# Patient Record
Sex: Male | Born: 1940 | Race: White | Hispanic: No | Smoking: Never smoker
Health system: Southern US, Community
[De-identification: ages and names within clinical notes are randomized; demographics above are authoritative.]

## PROBLEM LIST (undated history)

## (undated) DIAGNOSIS — D039 Melanoma in situ, unspecified: Secondary | ICD-10-CM

## (undated) DIAGNOSIS — Z9989 Dependence on other enabling machines and devices: Secondary | ICD-10-CM

## (undated) DIAGNOSIS — G4730 Sleep apnea, unspecified: Secondary | ICD-10-CM

## (undated) DIAGNOSIS — D229 Melanocytic nevi, unspecified: Secondary | ICD-10-CM

## (undated) DIAGNOSIS — I10 Essential (primary) hypertension: Secondary | ICD-10-CM

## (undated) DIAGNOSIS — G459 Transient cerebral ischemic attack, unspecified: Secondary | ICD-10-CM

## (undated) DIAGNOSIS — G4733 Obstructive sleep apnea (adult) (pediatric): Secondary | ICD-10-CM

## (undated) DIAGNOSIS — C4491 Basal cell carcinoma of skin, unspecified: Secondary | ICD-10-CM

## (undated) DIAGNOSIS — R232 Flushing: Secondary | ICD-10-CM

## (undated) DIAGNOSIS — E669 Obesity, unspecified: Secondary | ICD-10-CM

## (undated) DIAGNOSIS — E785 Hyperlipidemia, unspecified: Secondary | ICD-10-CM

## (undated) DIAGNOSIS — C4492 Squamous cell carcinoma of skin, unspecified: Secondary | ICD-10-CM

## (undated) HISTORY — DX: Obesity, unspecified: E66.9

## (undated) HISTORY — DX: Dependence on other enabling machines and devices: Z99.89

## (undated) HISTORY — DX: Essential (primary) hypertension: I10

## (undated) HISTORY — DX: Obstructive sleep apnea (adult) (pediatric): G47.33

## (undated) HISTORY — DX: Hyperlipidemia, unspecified: E78.5

## (undated) HISTORY — DX: Flushing: R23.2

## (undated) HISTORY — DX: Transient cerebral ischemic attack, unspecified: G45.9

## (undated) HISTORY — DX: Sleep apnea, unspecified: G47.30

---
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
## 1998-12-20 HISTORY — PX: LASIK: SHX215

## 2000-12-20 HISTORY — PX: MOHS SURGERY: SUR867

## 2007-04-03 DIAGNOSIS — D229 Melanocytic nevi, unspecified: Secondary | ICD-10-CM

## 2007-04-03 HISTORY — DX: Melanocytic nevi, unspecified: D22.9

## 2011-02-13 ENCOUNTER — Emergency Department (HOSPITAL_COMMUNITY)
Admission: EM | Admit: 2011-02-13 | Discharge: 2011-02-13 | Disposition: A | Discharge status: Home / Self Care | Payer: 59 | Attending: Emergency Medicine | Admitting: Emergency Medicine

## 2011-02-13 DIAGNOSIS — I10 Essential (primary) hypertension: Secondary | ICD-10-CM | POA: Insufficient documentation

## 2011-02-13 DIAGNOSIS — Z79899 Other long term (current) drug therapy: Secondary | ICD-10-CM | POA: Insufficient documentation

## 2011-09-08 ENCOUNTER — Encounter (INDEPENDENT_AMBULATORY_CARE_PROVIDER_SITE_OTHER): Payer: 59 | Admitting: Ophthalmology

## 2011-09-08 DIAGNOSIS — H43819 Vitreous degeneration, unspecified eye: Secondary | ICD-10-CM

## 2011-09-08 DIAGNOSIS — H33309 Unspecified retinal break, unspecified eye: Secondary | ICD-10-CM

## 2011-09-08 DIAGNOSIS — H2510 Age-related nuclear cataract, unspecified eye: Secondary | ICD-10-CM

## 2011-10-21 DEATH — deceased

## 2012-09-07 ENCOUNTER — Encounter (INDEPENDENT_AMBULATORY_CARE_PROVIDER_SITE_OTHER): Payer: 59 | Admitting: Ophthalmology

## 2012-09-13 ENCOUNTER — Ambulatory Visit (INDEPENDENT_AMBULATORY_CARE_PROVIDER_SITE_OTHER): Payer: BC Managed Care – PPO | Admitting: Ophthalmology

## 2012-09-13 ENCOUNTER — Encounter (INDEPENDENT_AMBULATORY_CARE_PROVIDER_SITE_OTHER): Payer: 59 | Admitting: Ophthalmology

## 2012-09-13 DIAGNOSIS — H2510 Age-related nuclear cataract, unspecified eye: Secondary | ICD-10-CM

## 2012-09-13 DIAGNOSIS — H33309 Unspecified retinal break, unspecified eye: Secondary | ICD-10-CM

## 2012-09-13 DIAGNOSIS — H35039 Hypertensive retinopathy, unspecified eye: Secondary | ICD-10-CM

## 2012-09-13 DIAGNOSIS — H43819 Vitreous degeneration, unspecified eye: Secondary | ICD-10-CM

## 2012-09-13 DIAGNOSIS — I10 Essential (primary) hypertension: Secondary | ICD-10-CM

## 2013-06-04 ENCOUNTER — Other Ambulatory Visit: Payer: Self-pay | Admitting: *Deleted

## 2013-06-04 MED ORDER — OLMESARTAN MEDOXOMIL-HCTZ 40-25 MG PO TABS: 1.0000 | ORAL_TABLET | Freq: Every day | ORAL | Status: DC

## 2013-07-19 ENCOUNTER — Other Ambulatory Visit: Payer: Self-pay | Admitting: Internal Medicine

## 2013-07-21 ENCOUNTER — Other Ambulatory Visit: Payer: Self-pay | Admitting: Internal Medicine

## 2013-07-23 NOTE — Telephone Encounter (Signed)
Rx was sent to pharmacy electronically. 

## 2013-08-07 ENCOUNTER — Other Ambulatory Visit: Payer: Self-pay | Admitting: *Deleted

## 2013-08-07 MED ORDER — HYDRALAZINE HCL 25 MG PO TABS: 25.0000 mg | ORAL_TABLET | Freq: Three times a day (TID) | ORAL | Status: DC

## 2013-09-13 ENCOUNTER — Ambulatory Visit (INDEPENDENT_AMBULATORY_CARE_PROVIDER_SITE_OTHER): Payer: BC Managed Care – PPO | Admitting: Ophthalmology

## 2013-09-13 DIAGNOSIS — H33309 Unspecified retinal break, unspecified eye: Secondary | ICD-10-CM

## 2013-09-13 DIAGNOSIS — H2510 Age-related nuclear cataract, unspecified eye: Secondary | ICD-10-CM

## 2013-09-13 DIAGNOSIS — I10 Essential (primary) hypertension: Secondary | ICD-10-CM

## 2013-09-13 DIAGNOSIS — H43819 Vitreous degeneration, unspecified eye: Secondary | ICD-10-CM

## 2013-09-13 DIAGNOSIS — H35039 Hypertensive retinopathy, unspecified eye: Secondary | ICD-10-CM

## 2013-09-19 ENCOUNTER — Telehealth: Payer: Self-pay | Admitting: Internal Medicine

## 2013-09-19 MED ORDER — METOPROLOL SUCCINATE ER 50 MG PO TB24: ORAL_TABLET | ORAL | Status: DC

## 2013-09-19 NOTE — Telephone Encounter (Signed)
Ok .. That's fine with me.  -Dr. Rennis Golden

## 2013-09-19 NOTE — Telephone Encounter (Signed)
Patient called wanting to decreased metoprolol succinate 100mg  QD dose, as he reports HR stays low (usually 50s-low 60s, as low as 40, with little increase in HR with exercise) and reports BP is usually 120s systolic, as low as 106 systolic. Does not report SE with the decreased HR, just wants to see if he is able to wean back on the dosage. Please advise. Thanks!

## 2013-09-19 NOTE — Telephone Encounter (Signed)
Please call-question about his medicine. °

## 2013-09-19 NOTE — Telephone Encounter (Signed)
Called patient with medication change instructions per Dr. Rennis Golden. Informed patient that is ok to decreased metoprolol succinate dose from 100mg  to 75mg  QD PO pr Dr. Rennis Golden. New prescription ordered and sent to pharmacy electronically. Informed patient to keep record of BPs and HR and to call our office with any drastic changes from his current baseline in either. Patient verbalized understanding and agreed with plan.

## 2013-10-08 ENCOUNTER — Other Ambulatory Visit: Payer: Self-pay | Admitting: *Deleted

## 2013-10-08 MED ORDER — AMLODIPINE BESYLATE 5 MG PO TABS: 5.0000 mg | ORAL_TABLET | Freq: Two times a day (BID) | ORAL | Status: DC

## 2014-04-08 ENCOUNTER — Other Ambulatory Visit: Payer: Self-pay | Admitting: Internal Medicine

## 2014-04-08 NOTE — Telephone Encounter (Signed)
Rx was sent to pharmacy electronically. 

## 2014-04-23 ENCOUNTER — Other Ambulatory Visit: Payer: Self-pay | Admitting: *Deleted

## 2014-04-23 MED ORDER — HYDRALAZINE HCL 25 MG PO TABS: 25.0000 mg | ORAL_TABLET | Freq: Three times a day (TID) | ORAL | Status: DC

## 2014-04-23 NOTE — Telephone Encounter (Signed)
Rx refill sent to patient pharmacy   

## 2014-05-22 ENCOUNTER — Other Ambulatory Visit: Payer: Self-pay | Admitting: Internal Medicine

## 2014-05-27 NOTE — Telephone Encounter (Signed)
Pt still have not gotten his Furosemide 40 mg # 30.. Would you please call it in to [phone]

## 2014-05-28 NOTE — Telephone Encounter (Signed)
Rx was sent to pharmacy electronically. Last OV 03/2013 

## 2014-06-08 ENCOUNTER — Other Ambulatory Visit: Payer: Self-pay | Admitting: Internal Medicine

## 2014-06-11 NOTE — Telephone Encounter (Signed)
Rx refill sent to pharmacy. Called patient and informed him that he needed an appointment, he ask to please have scheduler call him.

## 2014-06-15 ENCOUNTER — Other Ambulatory Visit: Payer: Self-pay | Admitting: Internal Medicine

## 2014-06-17 NOTE — Telephone Encounter (Signed)
Rx refill sent to patient pharmacy   

## 2014-06-24 ENCOUNTER — Other Ambulatory Visit: Payer: Self-pay | Admitting: Internal Medicine

## 2014-06-25 NOTE — Telephone Encounter (Signed)
Rx refill sent to patient pharmacy   

## 2014-06-27 ENCOUNTER — Encounter: Payer: Self-pay | Admitting: *Deleted

## 2014-07-11 ENCOUNTER — Encounter: Payer: Self-pay | Admitting: Internal Medicine

## 2014-07-11 ENCOUNTER — Ambulatory Visit (INDEPENDENT_AMBULATORY_CARE_PROVIDER_SITE_OTHER): Payer: BC Managed Care – PPO | Admitting: Internal Medicine

## 2014-07-11 VITALS — BP 160/80 | HR 81 | Ht 67.0 in | Wt 196.5 lb

## 2014-07-11 DIAGNOSIS — Z789 Other specified health status: Secondary | ICD-10-CM

## 2014-07-11 DIAGNOSIS — I10 Essential (primary) hypertension: Secondary | ICD-10-CM

## 2014-07-11 DIAGNOSIS — Z8673 Personal history of transient ischemic attack (TIA), and cerebral infarction without residual deficits: Secondary | ICD-10-CM

## 2014-07-11 DIAGNOSIS — E785 Hyperlipidemia, unspecified: Secondary | ICD-10-CM

## 2014-07-11 DIAGNOSIS — G4733 Obstructive sleep apnea (adult) (pediatric): Secondary | ICD-10-CM

## 2014-07-11 DIAGNOSIS — Z9989 Dependence on other enabling machines and devices: Secondary | ICD-10-CM

## 2014-07-11 MED ORDER — HYDRALAZINE HCL 25 MG PO TABS: 25.0000 mg | ORAL_TABLET | Freq: Three times a day (TID) | ORAL | Status: DC

## 2014-07-11 NOTE — Patient Instructions (Signed)
Your physician wants you to follow-up in: 1 year. You will receive a reminder letter in the mail two months in advance. If you don't receive a letter, please call our office to schedule the follow-up appointment.  

## 2014-07-16 ENCOUNTER — Encounter: Payer: Self-pay | Admitting: Internal Medicine

## 2014-07-16 DIAGNOSIS — Z8673 Personal history of transient ischemic attack (TIA), and cerebral infarction without residual deficits: Secondary | ICD-10-CM | POA: Insufficient documentation

## 2014-07-16 DIAGNOSIS — Z789 Other specified health status: Secondary | ICD-10-CM | POA: Insufficient documentation

## 2014-07-16 DIAGNOSIS — E66811 Obesity, class 1: Secondary | ICD-10-CM | POA: Insufficient documentation

## 2014-07-16 DIAGNOSIS — G4733 Obstructive sleep apnea (adult) (pediatric): Secondary | ICD-10-CM | POA: Insufficient documentation

## 2014-07-16 DIAGNOSIS — E785 Hyperlipidemia, unspecified: Secondary | ICD-10-CM | POA: Insufficient documentation

## 2014-07-16 DIAGNOSIS — E669 Obesity, unspecified: Secondary | ICD-10-CM | POA: Insufficient documentation

## 2014-07-16 DIAGNOSIS — I10 Essential (primary) hypertension: Secondary | ICD-10-CM | POA: Insufficient documentation

## 2014-07-16 DIAGNOSIS — Z9989 Dependence on other enabling machines and devices: Secondary | ICD-10-CM

## 2014-07-16 NOTE — Progress Notes (Signed)
OFFICE NOTE  Chief Complaint:  Annual visit, occasional leg swelling  Primary Care Physician: No primary provider on file.  HPI:  Zachary Murphy  is a pleasant 73 year old gentleman with a history of possible TIA in the past, sleep apnea, on CPAP, hypertension, and dyslipidemia. He is not interested in statin medications due to families and friends who have had complications with them. Overall, he is doing fairly well. He started to get more activity and exercise outdoors. He has been chopping wood, doing yard work. He has had no chest pain, shortness of breath, palpitations, presyncope, or syncopal symptoms. He does have uncontrollable hypertension and some fatigue during the day. He is being treated with CPAP. He does note some improvement with that, and there was a question of whether he would benefit from [place] or Provigil in the past. As far as blood pressure medications, he continues on clonidine, which certainly is fatiguing for him, and we have tried to wean that down.  Today Zachary Murphy reports doing really well. He said the most energy is had in some time. Despite that he seems to be under the most stress she's been under. He is working towards retirement within the next year and has multiple projects is working on. In general though his blood pressure has been very well controlled despite reducing his clonidine. He likes to reduce that further if he could because of the side effects. We have decreased his metoprolol to 50 mg daily and in general he is doing quite well. He did bring a list of home blood pressures which generally range from the 6010'X to 323F systolic. He denies any chest pain or shortness of breath.  PMHx:  Past Medical History  Diagnosis Date  . TIA (transient ischemic attack)     Mini  . OSA on CPAP   . Hypertension   . Dyslipidemia   . Obesity     History reviewed. No pertinent past surgical history.  FAMHx:  Family History  Problem Relation Age of  Onset  . Stroke Mother   . Stroke Father   . Hypertension Brother     SOCHx:   reports that he has never smoked. He has never used smokeless tobacco. He reports that he drinks about 7 ounces of alcohol per week. He reports that he does not use illicit drugs.  ALLERGIES:  Allergies  Allergen Reactions  . Statins Other (See Comments)    myalgias    ROS: A comprehensive review of systems was negative except for: Cardiovascular: positive for lower extremity edema  HOME MEDS: Current Outpatient Prescriptions  Medication Sig Dispense Refill  . amLODipine (NORVASC) 5 MG tablet Take 1 tablet (5 mg total) by mouth daily. Appointment needed for further refills  30 tablet  1  . aspirin 325 MG tablet Take 325 mg by mouth daily.      . Cholecalciferol (VITAMIN D-3) 1000 UNITS CAPS Take 1 capsule by mouth daily.      . cloNIDine (CATAPRES) 0.2 MG tablet Take 0.2 mg by mouth 2 (two) times daily.      . furosemide (LASIX) 40 MG tablet Take 1 tablet (40 mg total) by mouth daily.  30 tablet  0  . hydrALAZINE (APRESOLINE) 25 MG tablet Take 1 tablet (25 mg total) by mouth 3 (three) times daily.  90 tablet  11  . metoprolol succinate (TOPROL-XL) 50 MG 24 hr tablet Take 100 mg by mouth daily. . Take with or immediately following a meal.      .  Multiple Vitamin (MULTIVITAMIN WITH MINERALS) TABS tablet Take 1 tablet by mouth daily.      . NON FORMULARY at bedtime. CPAP      . olmesartan-hydrochlorothiazide (BENICAR HCT) 40-25 MG per tablet Take 1 tablet by mouth daily.  30 tablet  2   No current facility-administered medications for this visit.    LABS/IMAGING: No results found for this or any previous visit (from the past 48 hour(s)). No results found.  VITALS: BP 160/80  Pulse 81  Ht 5\' 7"  (1.702 m)  Wt 196 lb 8 oz (89.132 kg)  BMI 30.77 kg/m2  EXAM: General appearance: alert and no distress Neck: no carotid bruit and no JVD Lungs: clear to auscultation bilaterally Heart: regular rate  and rhythm, S1, S2 normal, no murmur, click, rub or gallop Abdomen: soft, non-tender; bowel sounds normal; no masses,  no organomegaly Extremities: extremities normal, atraumatic, no cyanosis or edema Pulses: 2+ and symmetric Skin: Skin color, texture, turgor normal. No rashes or lesions Neurologic: Grossly normal PSych: Mood, affect normal  EKG: Normal sinus rhythm at 81  ASSESSMENT: 1. Hypertension-controlled 2. Obesity with weight loss efforts in place 3. Obstructive sleep apnea CPAP 4. Dyslipidemia-statin intolerant 5. History of TIA  PLAN: 1.   Zachary Murphy is doing well overall. His blood pressure is well controlled. He continues to lose some weight which I have applauded him on. He continues to use CPAP with good sleep at night and energy levels. Unfortunately he is statin intolerant and does need low cholesterol given his risk factors for heart disease and history of prior TIA.  I've recommended again a cholesterol check but he does not want to get that at this time. I'll plan to see him back annually or sooner as necessary.  Pixie Casino, MD, [facility] Attending Cardiologist [facility]  Zachary Murphy 07/16/2014, 7:47 PM

## 2014-07-22 ENCOUNTER — Other Ambulatory Visit: Payer: Self-pay | Admitting: Internal Medicine

## 2014-07-23 NOTE — Telephone Encounter (Signed)
Rx refill sent to patient pharmacy   

## 2014-08-13 ENCOUNTER — Other Ambulatory Visit: Payer: Self-pay | Admitting: Internal Medicine

## 2014-08-13 NOTE — Telephone Encounter (Signed)
Rx was sent to pharmacy electronically. 

## 2014-09-07 ENCOUNTER — Encounter: Payer: Self-pay | Admitting: Internal Medicine

## 2014-09-15 ENCOUNTER — Other Ambulatory Visit: Payer: Self-pay | Admitting: Internal Medicine

## 2014-09-16 NOTE — Telephone Encounter (Signed)
Rx was sent to pharmacy electronically. 

## 2014-09-17 ENCOUNTER — Ambulatory Visit (INDEPENDENT_AMBULATORY_CARE_PROVIDER_SITE_OTHER): Payer: BC Managed Care – PPO | Admitting: Ophthalmology

## 2014-09-19 ENCOUNTER — Ambulatory Visit (INDEPENDENT_AMBULATORY_CARE_PROVIDER_SITE_OTHER): Payer: BC Managed Care – PPO | Admitting: Ophthalmology

## 2014-09-19 DIAGNOSIS — I10 Essential (primary) hypertension: Secondary | ICD-10-CM

## 2014-09-19 DIAGNOSIS — H43813 Vitreous degeneration, bilateral: Secondary | ICD-10-CM

## 2014-09-19 DIAGNOSIS — H35033 Hypertensive retinopathy, bilateral: Secondary | ICD-10-CM

## 2014-09-19 DIAGNOSIS — H33303 Unspecified retinal break, bilateral: Secondary | ICD-10-CM

## 2014-10-16 ENCOUNTER — Other Ambulatory Visit: Payer: Self-pay | Admitting: Internal Medicine

## 2014-10-22 ENCOUNTER — Telehealth: Payer: Self-pay | Admitting: Internal Medicine

## 2014-10-22 MED ORDER — METOPROLOL SUCCINATE ER 100 MG PO TB24: ORAL_TABLET | ORAL | Status: DC

## 2014-10-22 NOTE — Telephone Encounter (Signed)
Per last refill note, and Dr Debara Pickett Patient wanted 100 mg metoprolol succ and take 1/2 tablet  Daily  Lattie Haw wanted to clarify prescription RN verified  LAST PRESCRIPTION IS CORRECT.

## 2014-10-22 NOTE — Telephone Encounter (Signed)
Called patient to clarify Metoprolol Succinate dose last night, lmtcb. Per last office note (07/11/14 with Dr Debara Pickett) he was taking Toprol XL 100 mg (2 50 mg tablets daily).  Patient returned call this morning. Per patient, he stating the dose was decreased to 75 mg. Patient reported that he has only been taking 50 mg of Metoprolol and that his heart rate has been consistently in the 60s (patient monitors blood pressure closely at home). Patient would actually like to have Metoprolol Succinate 100 mg tablets to cut in half.  Routed to [facility] to advise.

## 2014-10-22 NOTE — Telephone Encounter (Signed)
Lattie Haw called in regards to the Metoprolol prescription. She states that there are 2 different sets of directions and needs to clarify which directions are correct. Please call  Thanks

## 2014-10-22 NOTE — Addendum Note (Signed)
Addended by: Diana Eves on: 10/22/2014 12:16 PM   Modules accepted: Orders

## 2014-10-22 NOTE — Telephone Encounter (Signed)
50 mg daily is fine - he can take 1/2 of the 100 mg Toprol XL.  Dr. Lemmie Evens

## 2015-01-04 ENCOUNTER — Other Ambulatory Visit: Payer: Self-pay | Admitting: Internal Medicine

## 2015-01-06 NOTE — Telephone Encounter (Signed)
Rx(s) sent to pharmacy electronically.  

## 2015-03-10 ENCOUNTER — Other Ambulatory Visit: Payer: Self-pay | Admitting: Internal Medicine

## 2015-03-10 NOTE — Telephone Encounter (Signed)
Rx has been sent to the pharmacy electronically. ° °

## 2015-06-13 DIAGNOSIS — C4491 Basal cell carcinoma of skin, unspecified: Secondary | ICD-10-CM

## 2015-06-13 HISTORY — DX: Basal cell carcinoma of skin, unspecified: C44.91

## 2015-07-06 ENCOUNTER — Other Ambulatory Visit: Payer: Self-pay | Admitting: Internal Medicine

## 2015-08-04 ENCOUNTER — Other Ambulatory Visit: Payer: Self-pay | Admitting: Internal Medicine

## 2015-08-04 NOTE — Telephone Encounter (Signed)
Rx(s) sent to pharmacy electronically.  

## 2015-08-23 ENCOUNTER — Other Ambulatory Visit: Payer: Self-pay | Admitting: Internal Medicine

## 2015-09-02 ENCOUNTER — Other Ambulatory Visit: Payer: Self-pay | Admitting: Internal Medicine

## 2015-09-09 ENCOUNTER — Encounter: Payer: Self-pay | Admitting: Gastroenterology

## 2015-10-08 ENCOUNTER — Ambulatory Visit (INDEPENDENT_AMBULATORY_CARE_PROVIDER_SITE_OTHER): Payer: BC Managed Care – PPO | Admitting: Ophthalmology

## 2015-10-25 ENCOUNTER — Other Ambulatory Visit: Payer: Self-pay | Admitting: Internal Medicine

## 2015-10-27 ENCOUNTER — Other Ambulatory Visit: Payer: Self-pay | Admitting: Internal Medicine

## 2015-10-27 MED ORDER — CLONIDINE HCL 0.1 MG PO TABS: ORAL_TABLET | ORAL | Status: DC

## 2015-10-28 ENCOUNTER — Ambulatory Visit (AMBULATORY_SURGERY_CENTER): Payer: Self-pay

## 2015-10-28 VITALS — Ht 67.0 in | Wt 200.0 lb

## 2015-10-28 DIAGNOSIS — Z1211 Encounter for screening for malignant neoplasm of colon: Secondary | ICD-10-CM

## 2015-10-28 MED ORDER — SUPREP BOWEL PREP KIT 17.5-3.13-1.6 GM/177ML PO SOLN: 1.0000 | Freq: Once | ORAL | Status: DC

## 2015-10-28 NOTE — Progress Notes (Signed)
No allergies to eggs or soy No past problems with anesthesia No diet/weight loss meds No home oxygen  Has email; refused emmi

## 2015-11-04 ENCOUNTER — Encounter: Payer: Self-pay | Admitting: Gastroenterology

## 2015-11-11 ENCOUNTER — Ambulatory Visit (AMBULATORY_SURGERY_CENTER): Payer: BLUE CROSS/BLUE SHIELD | Admitting: Gastroenterology

## 2015-11-11 ENCOUNTER — Encounter: Payer: Self-pay | Admitting: Gastroenterology

## 2015-11-11 VITALS — BP 148/71 | HR 65 | Temp 96.8°F | Resp 21 | Ht 67.0 in | Wt 200.0 lb

## 2015-11-11 DIAGNOSIS — Z1211 Encounter for screening for malignant neoplasm of colon: Secondary | ICD-10-CM

## 2015-11-11 DIAGNOSIS — D124 Benign neoplasm of descending colon: Secondary | ICD-10-CM | POA: Diagnosis not present

## 2015-11-11 DIAGNOSIS — D123 Benign neoplasm of transverse colon: Secondary | ICD-10-CM | POA: Diagnosis not present

## 2015-11-11 MED ORDER — SODIUM CHLORIDE 0.9 % IV SOLN: 500.0000 mL | INTRAVENOUS | Status: DC

## 2015-11-11 NOTE — Progress Notes (Signed)
No problems noted in the recovery room. maw 

## 2015-11-11 NOTE — Patient Instructions (Signed)
YOU HAD AN ENDOSCOPIC PROCEDURE TODAY AT THE [facility]:   Refer to the procedure report that was given to you for any specific questions about what was found during the examination.  If the procedure report does not answer your questions, please call your gastroenterologist to clarify.  If you requested that your care partner not be given the details of your procedure findings, then the procedure report has been included in a sealed envelope for you to review at your convenience later.  YOU SHOULD EXPECT: Some feelings of bloating in the abdomen. Passage of more gas than usual.  Walking can help get rid of the air that was put into your GI tract during the procedure and reduce the bloating. If you had a lower endoscopy (such as a colonoscopy or flexible sigmoidoscopy) you may notice spotting of blood in your stool or on the toilet paper. If you underwent a bowel prep for your procedure, you may not have a normal bowel movement for a few days.  Please Note:  You might notice some irritation and congestion in your nose or some drainage.  This is from the oxygen used during your procedure.  There is no need for concern and it should clear up in a day or so.  SYMPTOMS TO REPORT IMMEDIATELY:   Following lower endoscopy (colonoscopy or flexible sigmoidoscopy):  Excessive amounts of blood in the stool  Significant tenderness or worsening of abdominal pains  Swelling of the abdomen that is new, acute  Fever of 100F or higher  For urgent or emergent issues, a gastroenterologist can be reached at any hour by calling [phone].   DIET: Your first meal following the procedure should be a small meal and then it is ok to progress to your normal diet. Heavy or fried foods are harder to digest and may make you feel nauseous or bloated.  Likewise, meals heavy in dairy and vegetables can increase bloating.  Drink plenty of fluids but you should avoid alcoholic beverages for 24  hours.  ACTIVITY:  You should plan to take it easy for the rest of today and you should NOT DRIVE or use heavy machinery until tomorrow (because of the sedation medicines used during the test).    FOLLOW UP: Our staff will call the number listed on your records the next business day following your procedure to check on you and address any questions or concerns that you may have regarding the information given to you following your procedure. If we do not reach you, we will leave a message.  However, if you are feeling well and you are not experiencing any problems, there is no need to return our call.  We will assume that you have returned to your regular daily activities without incident.  If any biopsies were taken you will be contacted by phone or by letter within the next 1-3 weeks.  Please call us at [phone] if you have not heard about the biopsies in 3 weeks.    SIGNATURES/CONFIDENTIALITY: You and/or your care partner have signed paperwork which will be entered into your electronic medical record.  These signatures attest to the fact that that the information above on your After Visit Summary has been reviewed and is understood.  Full responsibility of the confidentiality of this discharge information lies with you and/or your care-partner.    Handouts were given to your care partner on polyps, diverticulosis, and a high fiber diet with liberal fluid intake. You may resume your   current medications today. Await biopsy results. Please call if any questions or concerns.   

## 2015-11-11 NOTE — Op Note (Signed)
[place]  [address] [place], [id number]   COLONOSCOPY PROCEDURE REPORT  PATIENT: Zachary Murphy, Zachary Murphy  MR#: [id number] BIRTHDATE: February 13, 1941 , 73  yrs. old GENDER: male ENDOSCOPIST: Milus Banister, MD REFERRED [id number] Ardeth Perfect, M.D. PROCEDURE DATE:  11/11/2015 PROCEDURE:   Colonoscopy, screening and Colonoscopy with snare polypectomy First Screening Colonoscopy - Avg.  risk and is 50 yrs.  old or older - No.  Prior Negative Screening - Now for repeat screening. 10 or more years since last screening  History of Adenoma - Now for follow-up colonoscopy & has been > or = to 3 yrs.  N/A  Polyps removed today? Yes ASA CLASS:   Class II INDICATIONS:Screening for colonic neoplasia, Colorectal Neoplasm Risk Assessment for this procedure is average risk, and per patient colonoscopy [place] 11 years ago was normal. MEDICATIONS: Monitored anesthesia care and Propofol 280 mg IV  DESCRIPTION OF PROCEDURE:   After the risks benefits and alternatives of the procedure were thoroughly explained, informed consent was obtained.  The digital rectal exam revealed no abnormalities of the rectum.   The [id number]  endoscope was introduced through the anus and advanced to the cecum, which was identified by both the appendix and ileocecal valve. No adverse events experienced.   The quality of the prep was excellent.  The instrument was then slowly withdrawn as the colon was fully examined. Estimated blood loss is zero unless otherwise noted in this procedure report.  COLON FINDINGS: Four sessile polyps ranging between 3-69mm in size were found in the descending colon and transverse colon. Polypectomies were performed with a cold snare.  The resection was complete, the polyp tissue was completely retrieved and sent to histology.   There was mild diverticulosis noted in the left colon. The examination was otherwise normal.  Retroflexed views revealed no abnormalities. The  time to cecum = 3.2 Withdrawal time = 17.1 The scope was withdrawn and the procedure completed. COMPLICATIONS: There were no immediate complications.  ENDOSCOPIC IMPRESSION: 1. Four sessile polyps ranging between 3-14mm in size were found in the descending colon and transverse colon; polypectomies were performed with a cold snare 2.   Mild diverticulosis was noted in the left colon 3.   The examination was otherwise normal  RECOMMENDATIONS: If the polyp(s) removed today are proven to be adenomatous (pre-cancerous) polyps, you will need a colonoscopy in 3-5 years. Otherwise you should continue to follow colorectal cancer screening guidelines for "routine risk" patients with a colonoscopy in 10 years.  You will receive a letter within 1-2 weeks with the results of your biopsy as well as final recommendations.  Please call my office if you have not received a letter after 3 weeks.  eSigned:  Milus Banister, MD 11/11/2015 11:08 AM

## 2015-11-11 NOTE — Progress Notes (Signed)
A/ox3 pleased with MAC, report to Annette RN 

## 2015-11-11 NOTE — Progress Notes (Signed)
Called to room to assist during endoscopic procedure.  Patient ID and intended procedure confirmed with present staff. Received instructions for my participation in the procedure from the performing physician.  

## 2015-11-12 ENCOUNTER — Telehealth: Payer: Self-pay | Admitting: *Deleted

## 2015-11-12 NOTE — Telephone Encounter (Signed)
  Follow up Call-  Call back number 11/11/2015  Post procedure Call Back phone  # [phone]  Permission to leave phone message Yes     Patient questions:  Do you have a fever, pain , or abdominal swelling? No. Pain Score  0 *  Have you tolerated food without any problems? Yes.    Have you been able to return to your normal activities? Yes.    Do you have any questions about your discharge instructions: Diet   No. Medications  No. Follow up visit  No.  Do you have questions or concerns about your Care? No.  Actions: * If pain score is 4 or above: No action needed, pain <4.

## 2015-11-19 ENCOUNTER — Encounter: Payer: Self-pay | Admitting: Gastroenterology

## 2015-11-26 ENCOUNTER — Other Ambulatory Visit: Payer: Self-pay | Admitting: Internal Medicine

## 2015-11-26 NOTE — Telephone Encounter (Signed)
REFILL 

## 2015-12-23 ENCOUNTER — Other Ambulatory Visit: Payer: Self-pay | Admitting: Internal Medicine

## 2016-01-01 ENCOUNTER — Ambulatory Visit (INDEPENDENT_AMBULATORY_CARE_PROVIDER_SITE_OTHER): Payer: Self-pay | Admitting: Ophthalmology

## 2016-01-23 ENCOUNTER — Ambulatory Visit (INDEPENDENT_AMBULATORY_CARE_PROVIDER_SITE_OTHER): Payer: Self-pay | Admitting: Ophthalmology

## 2016-01-25 ENCOUNTER — Other Ambulatory Visit: Payer: Self-pay | Admitting: Internal Medicine

## 2016-01-26 NOTE — Telephone Encounter (Signed)
Rx request sent to pharmacy.  

## 2016-02-02 ENCOUNTER — Ambulatory Visit (INDEPENDENT_AMBULATORY_CARE_PROVIDER_SITE_OTHER): Payer: BLUE CROSS/BLUE SHIELD | Admitting: Ophthalmology

## 2016-02-02 DIAGNOSIS — H43813 Vitreous degeneration, bilateral: Secondary | ICD-10-CM | POA: Diagnosis not present

## 2016-02-02 DIAGNOSIS — I10 Essential (primary) hypertension: Secondary | ICD-10-CM

## 2016-02-02 DIAGNOSIS — H33303 Unspecified retinal break, bilateral: Secondary | ICD-10-CM | POA: Diagnosis not present

## 2016-02-02 DIAGNOSIS — H35372 Puckering of macula, left eye: Secondary | ICD-10-CM | POA: Diagnosis not present

## 2016-02-02 DIAGNOSIS — H35033 Hypertensive retinopathy, bilateral: Secondary | ICD-10-CM

## 2016-02-02 DIAGNOSIS — H353121 Nonexudative age-related macular degeneration, left eye, early dry stage: Secondary | ICD-10-CM

## 2016-02-02 DIAGNOSIS — H2513 Age-related nuclear cataract, bilateral: Secondary | ICD-10-CM

## 2016-02-24 ENCOUNTER — Other Ambulatory Visit (HOSPITAL_COMMUNITY): Payer: Self-pay | Admitting: Urology

## 2016-02-24 DIAGNOSIS — R972 Elevated prostate specific antigen [PSA]: Secondary | ICD-10-CM

## 2016-03-15 ENCOUNTER — Ambulatory Visit (HOSPITAL_COMMUNITY)
Admission: RE | Admit: 2016-03-15 | Discharge: 2016-03-15 | Disposition: A | Discharge status: Home / Self Care | Payer: BLUE CROSS/BLUE SHIELD | Source: Ambulatory Visit | Attending: Urology | Admitting: Urology

## 2016-03-15 DIAGNOSIS — R972 Elevated prostate specific antigen [PSA]: Secondary | ICD-10-CM | POA: Diagnosis present

## 2016-03-15 DIAGNOSIS — N400 Enlarged prostate without lower urinary tract symptoms: Secondary | ICD-10-CM | POA: Insufficient documentation

## 2016-03-15 LAB — POCT I-STAT CREATININE: Creatinine, Ser: 0.9 mg/dL (ref 0.61–1.24)

## 2016-03-15 MED ORDER — GADOBENATE DIMEGLUMINE 529 MG/ML IV SOLN
19.0000 mL | Freq: Once | INTRAVENOUS | Status: AC | PRN
Start: 2016-03-15 — End: 2016-03-15
  Administered 2016-03-15: 19 mL via INTRAVENOUS

## 2016-04-01 IMAGING — MR MR PROSTATE WO/W CM
23 of 53 series · 23 of 53 positions shown · IV contrast (yes)
Comparison: Biopsy dated 11/30/2012.

CLINICAL DATA: Elevated PSA.  Biopsy planned in [place].

EXAM:
MR PROSTATE WITHOUT AND WITH CONTRAST
TECHNIQUE: Multiplanar multisequence MRI images were obtained of the pelvis
centered about the prostate. Pre and post contrast images were
obtained.
CONTRAST:  19mL MULTIHANCE GADOBENATE DIMEGLUMINE 529 MG/ML IV SOLN

[Series 3: bSSFP fat-sat · axial · 6.0mm · 0.86mm/px · 1 of 44 slices shown]
[im 1/44]
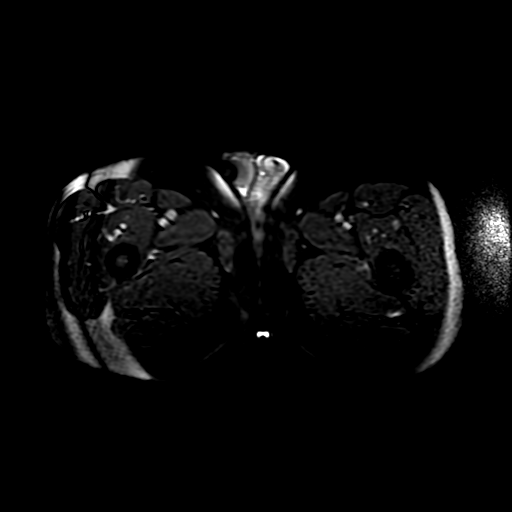

[Series 4: T1 · axial · 8.0mm · 0.70mm/px · 1 of 30 slices shown (1 of 2)]
[im 1/30]
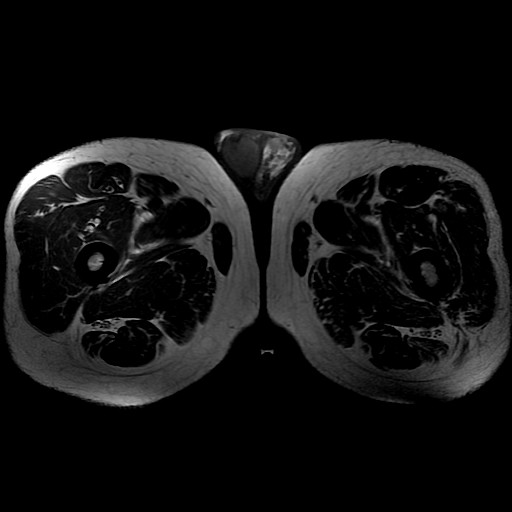

[Series 5: T2 · axial · 3.0mm · 0.29mm/px · 1 of 27 slices shown (1 of 3)]
[im 1/27]
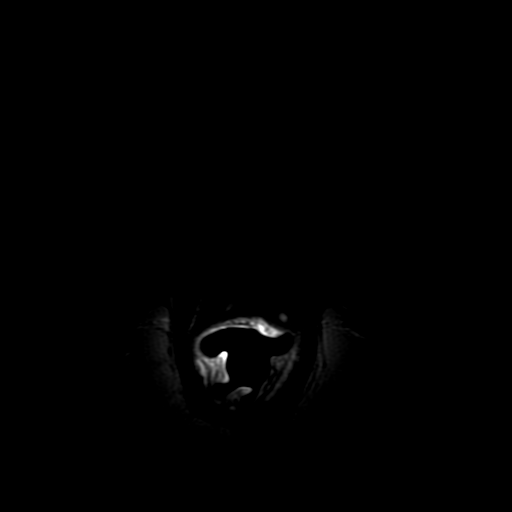

[Series 6: T1 · axial · 3.0mm · 0.29mm/px · 1 of 27 slices shown (2 of 2)]
[im 1/27]
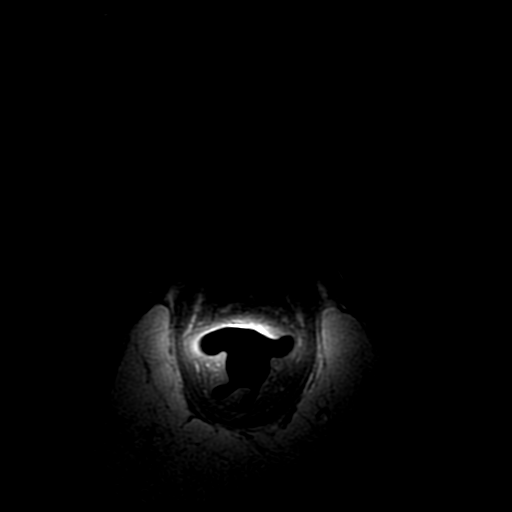

[Series 7: T2 · sagittal · 4.0mm · 0.29mm/px · 1 of 23 slices shown (2 of 3)]
[im 1/23]
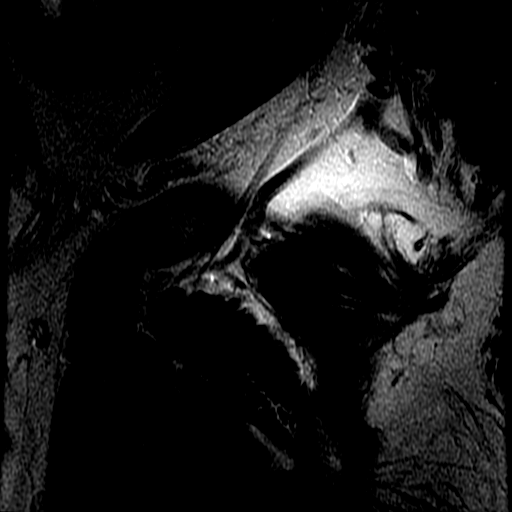

[Series 8: T2 · coronal · 4.0mm · 0.29mm/px · 1 of 21 slices shown (3 of 3)]
[im 1/21]
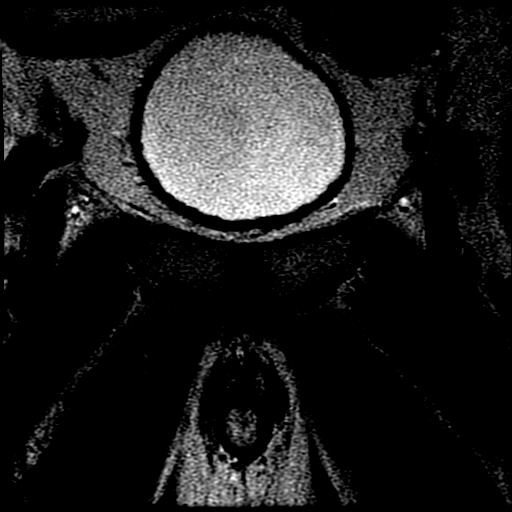

[Series 9: DWI · axial · 3.0mm · 0.59mm/px · 1 of 52 slices shown (1 of 6)]
[im 1/52]
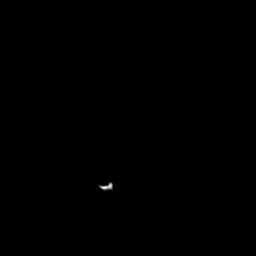

[Series 10: DWI · axial · 3.0mm · 0.59mm/px · 1 of 51 slices shown (2 of 6)]
[im 1/51]
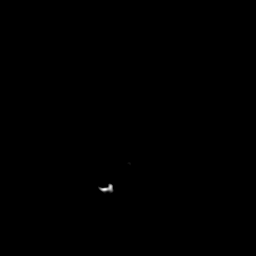

[Series 11: DWI · axial · 3.0mm · 0.59mm/px · 1 of 50 slices shown (3 of 6)]
[im 1/50]
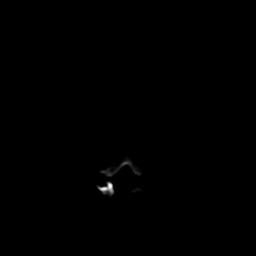

[Series 900: DWI · axial · 3.0mm · 0.59mm/px · 1 of 27 slices shown (4 of 6)]
[im 1/27]
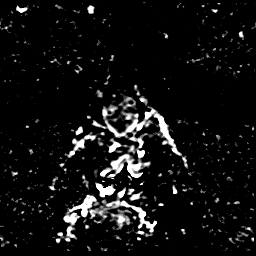

[Series 1000: DWI · axial · 3.0mm · 0.59mm/px · 1 of 27 slices shown (5 of 6)]
[im 1/27]
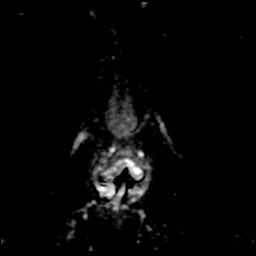

[Series 1100: DWI · axial · 3.0mm · 0.59mm/px · 1 of 27 slices shown (6 of 6)]
[im 1/27]
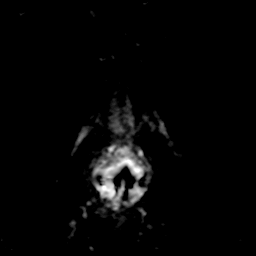

[((id)/(id)/1)-((id)/(id)/1) · axial · 3.0mm · 0.43mm/px · 1 of 76 slices shown (1 of 11)]
[im 1/76]
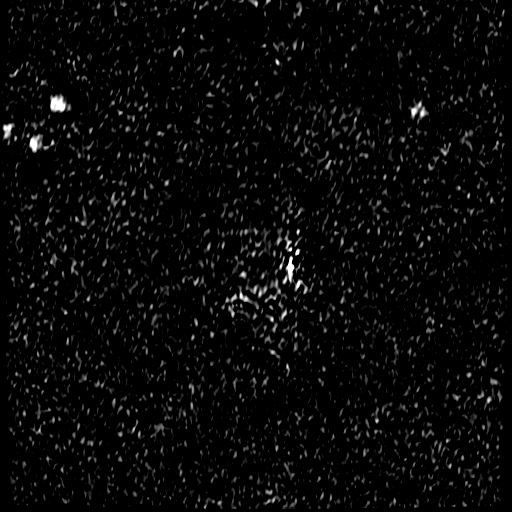

[((id)/(id)/1)-((id)/(id)/1) · axial · 3.0mm · 0.43mm/px · 1 of 75 slices shown (2 of 11)]
[im 1/75]
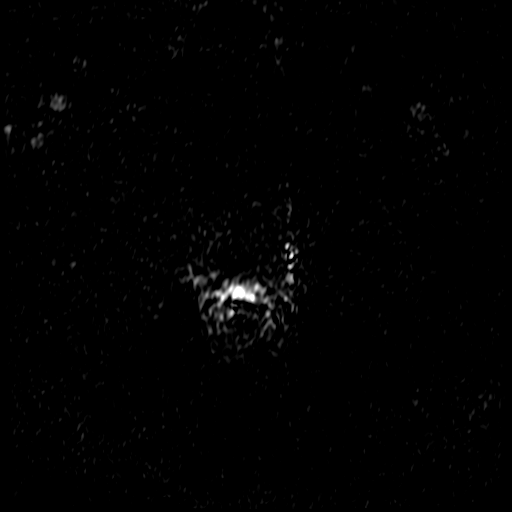

[((id)/(id)/1)-((id)/(id)/1) · axial · 3.0mm · 0.43mm/px · 1 of 76 slices shown (3 of 11)]
[im 1/76]
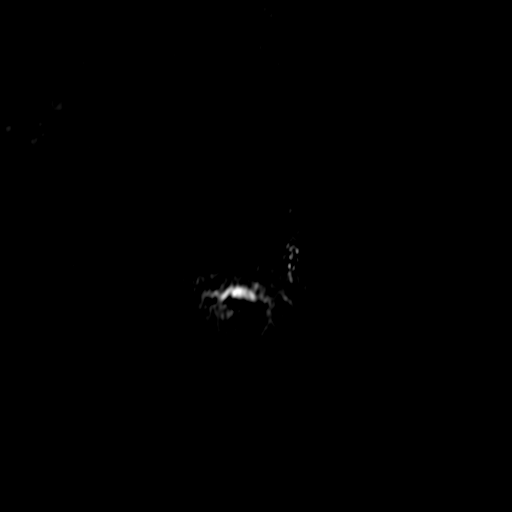

[((id)/(id)/1)-((id)/(id)/1) · axial · 3.0mm · 0.43mm/px · 1 of 76 slices shown (4 of 11)]
[im 1/76]
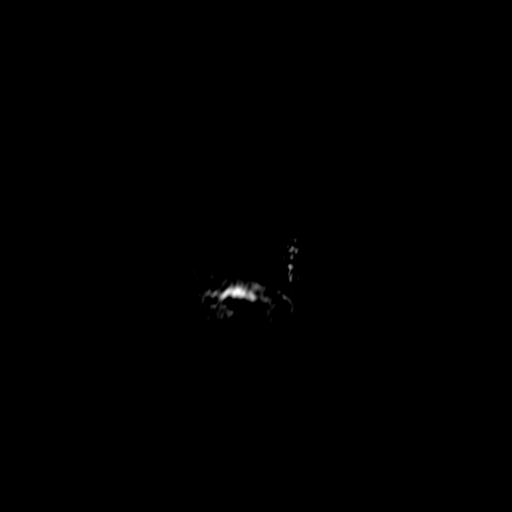

[((id)/(id)/1)-((id)/(id)/1) · axial · 3.0mm · 0.43mm/px · 1 of 76 slices shown (5 of 11)]
[im 1/76]
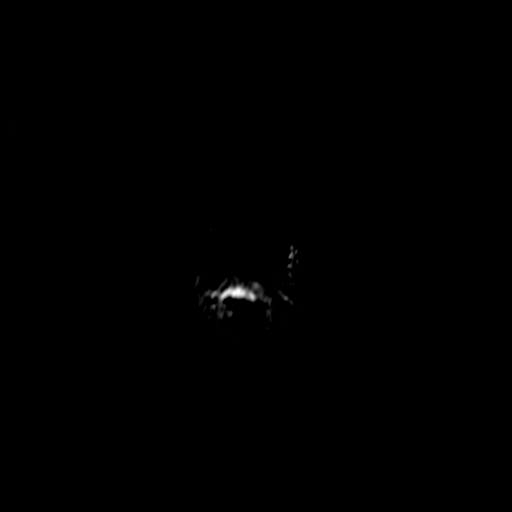

[((id)/(id)/1)-((id)/(id)/1) · axial · 3.0mm · 0.43mm/px · 1 of 75 slices shown (6 of 11)]
[im 1/75]
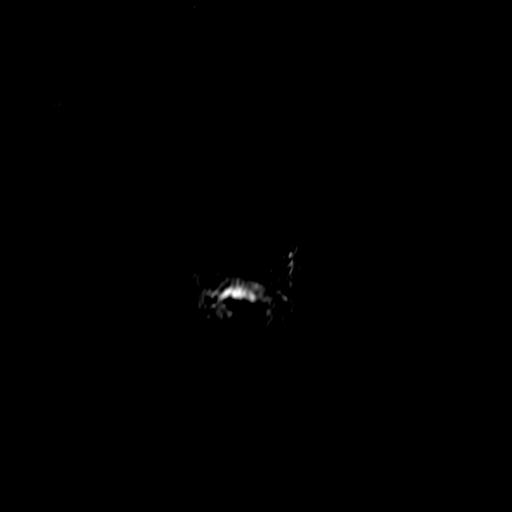

[((id)/(id)/1)-((id)/(id)/1) · axial · 3.0mm · 0.43mm/px · 1 of 74 slices shown (7 of 11)]
[im 1/74]
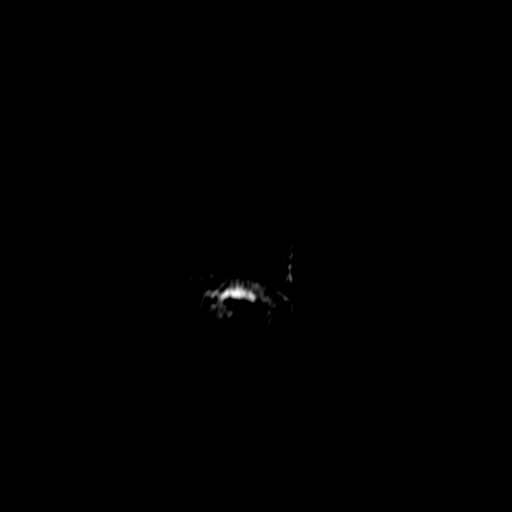

[((id)/(id)/1)-((id)/(id)/1) · axial · 3.0mm · 0.43mm/px · 1 of 75 slices shown (8 of 11)]
[im 1/75]
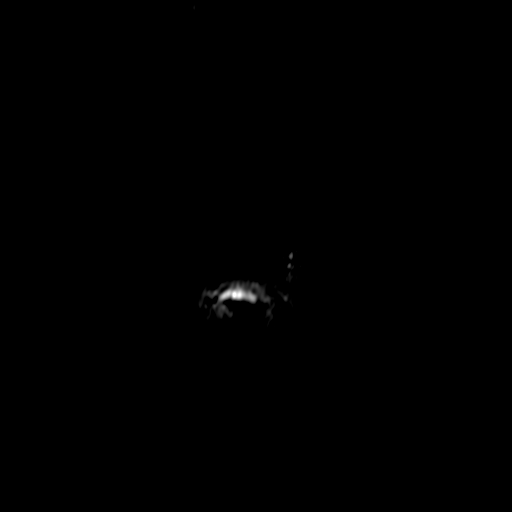

[((id)/(id)/1)-((id)/(id)/1) · axial · 3.0mm · 0.43mm/px · 1 of 71 slices shown (9 of 11)]
[im 1/71]
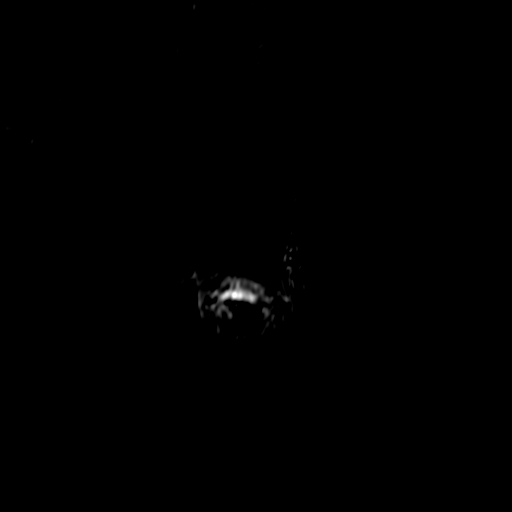

[((id)/(id)/1)-((id)/(id)/1) · axial · 3.0mm · 0.43mm/px · 1 of 74 slices shown (10 of 11)]
[im 1/74]
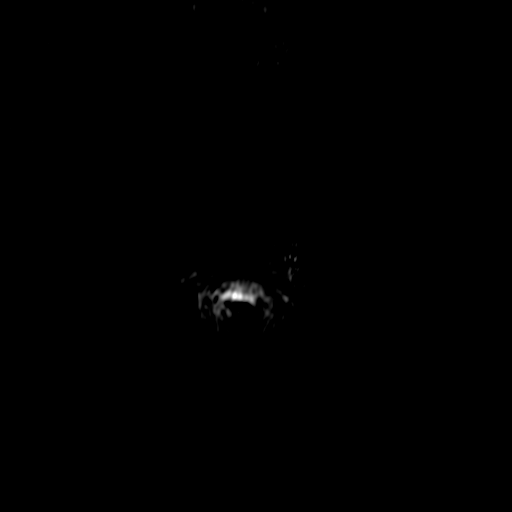

[((id)/(id)/1)-((id)/(id)/1) · axial · 3.0mm · 0.43mm/px · 1 of 75 slices shown (11 of 11)]
[im 1/75]
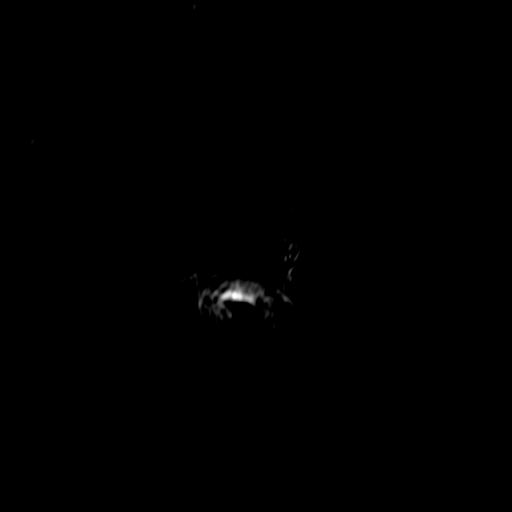

[23 of 53 positions shown; findings below may reference images not displayed]

FINDINGS: Prostate: Demonstrates moderate central gland enlargement and
heterogeneity, consistent with benign prostatic hyperplasia.

Peripheral zone mildly compressed by central gland enlargement.
Nonspecific heterogeneous T2 signal throughout, without focal
masslike T2 hypo intensity to suggest macroscopic carcinoma.

No restricted diffusion or early post-contrast enhancement
identified.

Transcapsular spread:  Absent

Seminal vesicle involvement: Absent

Neurovascular bundle involvement: Absent

Pelvic adenopathy: Absent

Bone metastasis: Absent

Other findings: Normal urinary bladder. Scattered colonic
diverticula. No free fluid.
IMPRESSION: 1. No findings to suggest macroscopic or high-grade prostate
carcinoma.
2. Moderate benign prostatic hyperplasia.

## 2017-02-01 ENCOUNTER — Ambulatory Visit (INDEPENDENT_AMBULATORY_CARE_PROVIDER_SITE_OTHER): Payer: BLUE CROSS/BLUE SHIELD | Admitting: Ophthalmology

## 2017-04-25 ENCOUNTER — Other Ambulatory Visit: Payer: Self-pay

## 2017-04-25 DIAGNOSIS — D039 Melanoma in situ, unspecified: Secondary | ICD-10-CM

## 2017-04-25 HISTORY — DX: Melanoma in situ, unspecified: D03.9

## 2017-06-16 ENCOUNTER — Other Ambulatory Visit: Payer: Self-pay | Admitting: Dermatology

## 2018-12-10 ENCOUNTER — Encounter: Payer: Self-pay | Admitting: Gastroenterology

## 2019-05-23 ENCOUNTER — Other Ambulatory Visit: Payer: Self-pay | Admitting: Dermatology

## 2019-05-23 DIAGNOSIS — C4492 Squamous cell carcinoma of skin, unspecified: Secondary | ICD-10-CM

## 2019-05-23 DIAGNOSIS — C4491 Basal cell carcinoma of skin, unspecified: Secondary | ICD-10-CM

## 2019-05-23 HISTORY — DX: Basal cell carcinoma of skin, unspecified: C44.91

## 2019-05-23 HISTORY — DX: Squamous cell carcinoma of skin, unspecified: C44.92

## 2019-07-05 ENCOUNTER — Encounter: Payer: Self-pay | Admitting: *Deleted

## 2019-11-21 ENCOUNTER — Other Ambulatory Visit: Payer: Self-pay | Admitting: Dermatology

## 2020-02-10 ENCOUNTER — Ambulatory Visit: Payer: Medicare HMO | Attending: Internal Medicine

## 2020-02-10 DIAGNOSIS — Z23 Encounter for immunization: Secondary | ICD-10-CM | POA: Insufficient documentation

## 2020-02-10 NOTE — Progress Notes (Signed)
   Covid-19 Vaccination Clinic  Name:  Zachary Murphy    MRN: [id number] DOB: June 08, 1941  02/10/2020  [name] was observed post Covid-19 immunization for 15 minutes without incidence. He was provided with Vaccine Information Sheet and instruction to access the V-Safe system.   [name] was instructed to call 911 with any severe reactions post vaccine: Marland Kitchen Difficulty breathing  . Swelling of your face and throat  . A fast heartbeat  . A bad rash all over your body  . Dizziness and weakness    Immunizations Administered    Name Date Dose VIS Date Route   Pfizer COVID-19 Vaccine 02/10/2020 12:57 PM 0.3 mL 11/30/2019 Intramuscular   Manufacturer: [place]   Lot: [id number]   [place]: [id number]     o

## 2020-03-05 ENCOUNTER — Ambulatory Visit: Payer: Medicare HMO | Attending: Internal Medicine

## 2020-03-05 DIAGNOSIS — Z23 Encounter for immunization: Secondary | ICD-10-CM

## 2020-03-05 NOTE — Progress Notes (Signed)
   Covid-19 Vaccination Clinic  Name:  Zachary Murphy    MRN: [id number] DOB: 12/30/1940  03/05/2020  [name] was observed post Covid-19 immunization for 15 minutes without incident. He was provided with Vaccine Information Sheet and instruction to access the V-Safe system.   [name] was instructed to call 911 with any severe reactions post vaccine: Marland Kitchen Difficulty breathing  . Swelling of face and throat  . A fast heartbeat  . A bad rash all over body  . Dizziness and weakness   Immunizations Administered    Name Date Dose VIS Date Route   Pfizer COVID-19 Vaccine 03/05/2020  9:04 AM 0.3 mL 11/30/2019 Intramuscular   Manufacturer: [place]   Lot: [id number]   [place]: [id number]

## 2020-12-22 ENCOUNTER — Other Ambulatory Visit: Payer: Medicare HMO

## 2020-12-24 ENCOUNTER — Other Ambulatory Visit: Payer: Medicare HMO

## 2022-10-28 ENCOUNTER — Other Ambulatory Visit: Payer: Self-pay | Admitting: Internal Medicine

## 2022-10-28 DIAGNOSIS — I700 Atherosclerosis of aorta: Secondary | ICD-10-CM

## 2022-11-01 ENCOUNTER — Ambulatory Visit
Admission: RE | Admit: 2022-11-01 | Discharge: 2022-11-01 | Disposition: A | Discharge status: Home / Self Care | Payer: No Typology Code available for payment source | Source: Ambulatory Visit | Attending: Internal Medicine | Admitting: Internal Medicine

## 2022-11-01 DIAGNOSIS — I700 Atherosclerosis of aorta: Secondary | ICD-10-CM
